# Patient Record
Sex: Male | Born: 1995 | Race: Black or African American | Hispanic: No | Marital: Single | Smoking: Never smoker
Health system: Southern US, Community
[De-identification: ages and names within clinical notes are randomized; demographics above are authoritative.]

---
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
## 2015-11-26 ENCOUNTER — Ambulatory Visit
Admission: RE | Admit: 2015-11-26 | Discharge: 2015-11-26 | Disposition: A | Discharge status: Home / Self Care | Source: Ambulatory Visit | Attending: Family Medicine | Admitting: Family Medicine

## 2015-11-26 ENCOUNTER — Other Ambulatory Visit: Payer: Self-pay | Admitting: Family Medicine

## 2015-11-26 DIAGNOSIS — R52 Pain, unspecified: Secondary | ICD-10-CM

## 2015-11-26 DIAGNOSIS — M25532 Pain in left wrist: Secondary | ICD-10-CM | POA: Insufficient documentation

## 2015-11-26 DIAGNOSIS — S62102A Fracture of unspecified carpal bone, left wrist, initial encounter for closed fracture: Secondary | ICD-10-CM | POA: Insufficient documentation

## 2015-11-26 DIAGNOSIS — W03XXXA Other fall on same level due to collision with another person, initial encounter: Secondary | ICD-10-CM | POA: Insufficient documentation

## 2015-11-26 DIAGNOSIS — Y9367 Activity, basketball: Secondary | ICD-10-CM | POA: Diagnosis not present

## 2015-11-29 HISTORY — PX: WRIST SURGERY: SHX841

## 2016-01-14 ENCOUNTER — Other Ambulatory Visit: Payer: Self-pay | Admitting: Family Medicine

## 2016-01-14 DIAGNOSIS — M25532 Pain in left wrist: Principal | ICD-10-CM

## 2016-01-14 DIAGNOSIS — G8929 Other chronic pain: Secondary | ICD-10-CM

## 2016-01-19 ENCOUNTER — Ambulatory Visit
Admission: RE | Admit: 2016-01-19 | Discharge: 2016-01-19 | Disposition: A | Discharge status: Home / Self Care | Source: Ambulatory Visit | Attending: Family Medicine | Admitting: Family Medicine

## 2016-01-19 DIAGNOSIS — G8929 Other chronic pain: Secondary | ICD-10-CM

## 2016-01-19 DIAGNOSIS — S62002D Unspecified fracture of navicular [scaphoid] bone of left wrist, subsequent encounter for fracture with routine healing: Secondary | ICD-10-CM | POA: Diagnosis not present

## 2016-01-19 DIAGNOSIS — X58XXXD Exposure to other specified factors, subsequent encounter: Secondary | ICD-10-CM | POA: Insufficient documentation

## 2016-01-19 DIAGNOSIS — M25532 Pain in left wrist: Secondary | ICD-10-CM

## 2016-04-29 ENCOUNTER — Other Ambulatory Visit: Payer: Self-pay | Admitting: Family Medicine

## 2016-04-29 ENCOUNTER — Ambulatory Visit (INDEPENDENT_AMBULATORY_CARE_PROVIDER_SITE_OTHER): Admitting: Family Medicine

## 2016-04-29 DIAGNOSIS — J029 Acute pharyngitis, unspecified: Secondary | ICD-10-CM

## 2016-04-30 LAB — CBC WITH DIFFERENTIAL/PLATELET
BASOS: 3 %
Basophils Absolute: 0.4 10*3/uL — ABNORMAL HIGH (ref 0.0–0.2)
EOS (ABSOLUTE): 0.1 10*3/uL (ref 0.0–0.4)
EOS: 0 %
HEMATOCRIT: 42.2 % (ref 37.5–51.0)
Hemoglobin: 14.5 g/dL (ref 12.6–17.7)
IMMATURE GRANS (ABS): 0.1 10*3/uL (ref 0.0–0.1)
IMMATURE GRANULOCYTES: 1 %
Lymphocytes Absolute: 8.9 10*3/uL — ABNORMAL HIGH (ref 0.7–3.1)
Lymphs: 58 %
MCH: 27.4 pg (ref 26.6–33.0)
MCHC: 34.4 g/dL (ref 31.5–35.7)
MCV: 80 fL (ref 79–97)
Monocytes Absolute: 1.9 10*3/uL — ABNORMAL HIGH (ref 0.1–0.9)
Monocytes: 12 %
NEUTROS PCT: 26 %
Neutrophils Absolute: 4 10*3/uL (ref 1.4–7.0)
Platelets: 178 10*3/uL (ref 150–379)
RBC: 5.3 x10E6/uL (ref 4.14–5.80)
RDW: 13.5 % (ref 12.3–15.4)
WBC: 15.2 10*3/uL — ABNORMAL HIGH (ref 3.4–10.8)

## 2016-04-30 LAB — MONONUCLEOSIS SCREEN: Mono Screen: NEGATIVE

## 2016-05-02 ENCOUNTER — Encounter: Payer: Self-pay | Admitting: Family Medicine

## 2016-05-02 ENCOUNTER — Ambulatory Visit (INDEPENDENT_AMBULATORY_CARE_PROVIDER_SITE_OTHER): Admitting: Family Medicine

## 2016-05-02 VITALS — BP 152/93 | HR 105 | Temp 98.3°F | Resp 16

## 2016-05-02 DIAGNOSIS — R591 Generalized enlarged lymph nodes: Secondary | ICD-10-CM

## 2016-05-02 DIAGNOSIS — IMO0001 Reserved for inherently not codable concepts without codable children: Secondary | ICD-10-CM

## 2016-05-02 DIAGNOSIS — R030 Elevated blood-pressure reading, without diagnosis of hypertension: Secondary | ICD-10-CM

## 2016-05-02 NOTE — Addendum Note (Signed)
Addended by: Dione HousekeeperPATEL, Early Ord N on: 05/02/2016 10:58 AM   Modules accepted: Orders

## 2016-05-02 NOTE — Progress Notes (Signed)
Patient ID: Herbert BreamSheldon Bowman, male   DOB: 08/13/1996, 20 y.o.   MRN: [id number]  Patient presents today for follow-up regarding sore throat. Patient states that the sore throat is better than when he saw me last week. He does still have some swollen lymph nodes. He denies any nasal congestion or fatigue. He denies any fever or abdominal pain.  ROS: Negative except mentioned above.  Vitals as per Epic. GENERAL: NAD HEENT: mild pharyngeal erythema, no exudate, no erythema of TMs, mild cervical LAD RESP: CTA B CARD: RRR ABD: +BS, NT, no organomegly appreciated NEURO: CN II-XII grossly intact   A/P: Lymphadenopathy- discussed results of CBC and mono spot test. Throat culture was done. Patient states that his sore throat is minimal at this point. He is feeling better. I would recommend that if he continues to have the lymphadenopathy that we repeat the CBC. He will return to my office next week if this is the case. I have also asked that he have his blood pressure repeated later this week prior to work out. If the blood pressure remains greater than 140/90 he will follow up with me as well. Activity as tolerated as long is afebrile without medication.

## 2016-05-02 NOTE — Progress Notes (Signed)
Patient ID: Herbert BreamSheldon Vieau, male   DOB: 01/17/1996, 20 y.o.   MRN: [id number]  Patient presents today for sore throat for 2 days. Subjective fever. No significant headache, no N/V/D. Minimal nasal drainage.   ROS: Negative except mentioned above.  Vitals as per Epic.  GENERAL: NAD HEENT: mild to moderate pharyngeal erythema, no exudate, no erythema of TMs, minimal cervical LAD RESP: CTA B CARD: RRR ABD: +BS, NT, no organomegly appreciated NEURO: CN II-XII grossly intact   A/P: Pharyngitis - will do CBC and monospot test, will inform patient if antibiotic needs to be called in. Any persistent or worsening symptoms follow-up. No athletics until results reviewed.

## 2016-05-02 NOTE — Addendum Note (Signed)
Addended by: Berneice GandyBROWN, Celest Reitz A on: 05/02/2016 10:50 AM   Modules accepted: K[employer] Brood[employer]Set

## 2016-05-05 LAB — CULTURE, GROUP A STREP

## 2017-03-28 ENCOUNTER — Encounter: Payer: Self-pay | Admitting: Family Medicine

## 2017-03-28 ENCOUNTER — Ambulatory Visit (INDEPENDENT_AMBULATORY_CARE_PROVIDER_SITE_OTHER): Admitting: Family Medicine

## 2017-03-28 VITALS — BP 154/61 | HR 45 | Temp 97.9°F | Resp 14

## 2017-03-28 DIAGNOSIS — Z202 Contact with and (suspected) exposure to infections with a predominantly sexual mode of transmission: Secondary | ICD-10-CM

## 2017-03-28 MED ORDER — AZITHROMYCIN 500 MG PO TABS: ORAL_TABLET | ORAL | 0 refills | Status: AC

## 2017-03-28 MED ORDER — CEFIXIME 400 MG PO CAPS: ORAL_CAPSULE | ORAL | 0 refills | Status: AC

## 2017-03-30 LAB — GC/CHLAMYDIA PROBE AMP
Chlamydia trachomatis, NAA: POSITIVE — AB
Neisseria gonorrhoeae by PCR: NEGATIVE

## 2017-03-30 NOTE — Progress Notes (Addendum)
Patient presents today with exposure to STD. Patient states that a partner of his told him that she has gonorrhea. He denies any symptoms such as dysuria, penile discharge, fever, chills, joint pain, rash. He denies any history of STDs in the past. He states that he did have protected sex with this partner.  ROS: Negative except mentioned above.  Vitals as per Epic. GENERAL: NAD HEENT: no pharyngeal erythema, no exudate, no erythema of TMs, no cervical LAD RESP: CTA B CARD: RRR GU: Deferred NEURO: CN II-XII grossly intact   A/P: Exposure to STD - counseled patient on safe sex practices, will treat patient with azithromycin and cefixime, will send urine for testing, encourage safe sex practices always, HIV testing encouraged through student health or Health Department. Seek medical attention if symptoms develop or any further problems/questions.   Results were discussed with patient. Patient has no symptoms. Patient took both medications above. Safe sex encouraged as well as HIV testing. Encouraged patient to tell recent partners. Informed him that health department may contact him. Patient had no questions/concerns.

## 2017-09-19 ENCOUNTER — Ambulatory Visit
Admission: RE | Admit: 2017-09-19 | Discharge: 2017-09-19 | Disposition: A | Discharge status: Home / Self Care | Source: Ambulatory Visit | Attending: Family Medicine | Admitting: Family Medicine

## 2017-09-19 ENCOUNTER — Other Ambulatory Visit: Payer: Self-pay | Admitting: Family Medicine

## 2017-09-19 DIAGNOSIS — Z8781 Personal history of (healed) traumatic fracture: Secondary | ICD-10-CM | POA: Diagnosis not present

## 2017-09-19 DIAGNOSIS — R609 Edema, unspecified: Secondary | ICD-10-CM | POA: Insufficient documentation

## 2017-09-19 DIAGNOSIS — R52 Pain, unspecified: Secondary | ICD-10-CM

## 2017-09-19 DIAGNOSIS — M25532 Pain in left wrist: Secondary | ICD-10-CM | POA: Insufficient documentation

## 2019-06-08 IMAGING — CR DG WRIST COMPLETE 3+V*L*
1 series · 4 of 4 positions shown · non-contrast
Comparison: CT 01/19/2016.  Plain film 11/26/2015.

CLINICAL DATA: Fall yesterday playing basketball. Pain and
swelling. Remote injury and surgery 2 years ago.

EXAM:
LEFT WRIST - COMPLETE 3+ VIEW

[Series 1: dg wrist complete left · 0.14mm/px · 4 of 4 slices shown]
[im 1/4]
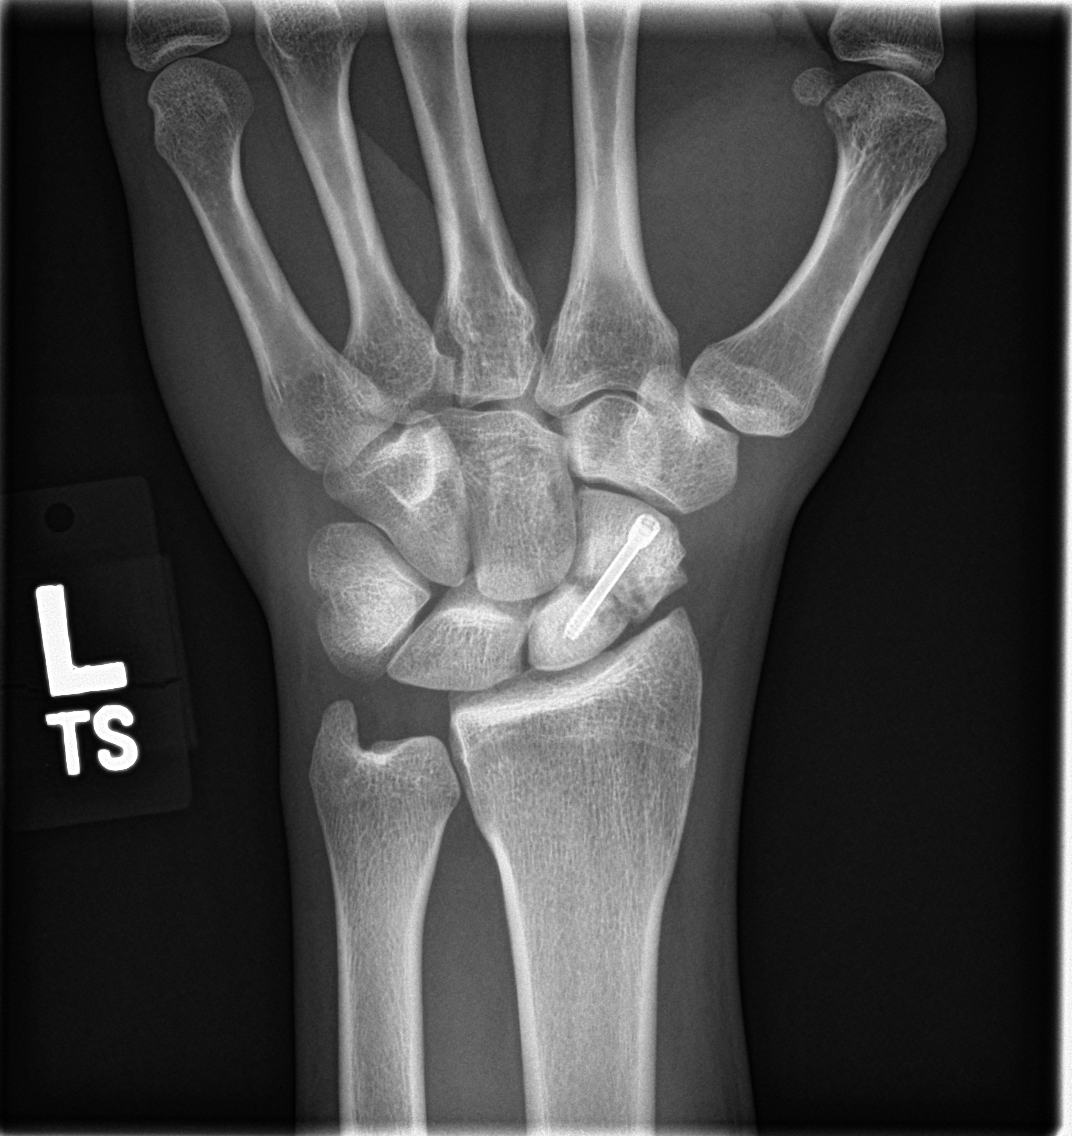
[im 2/4]
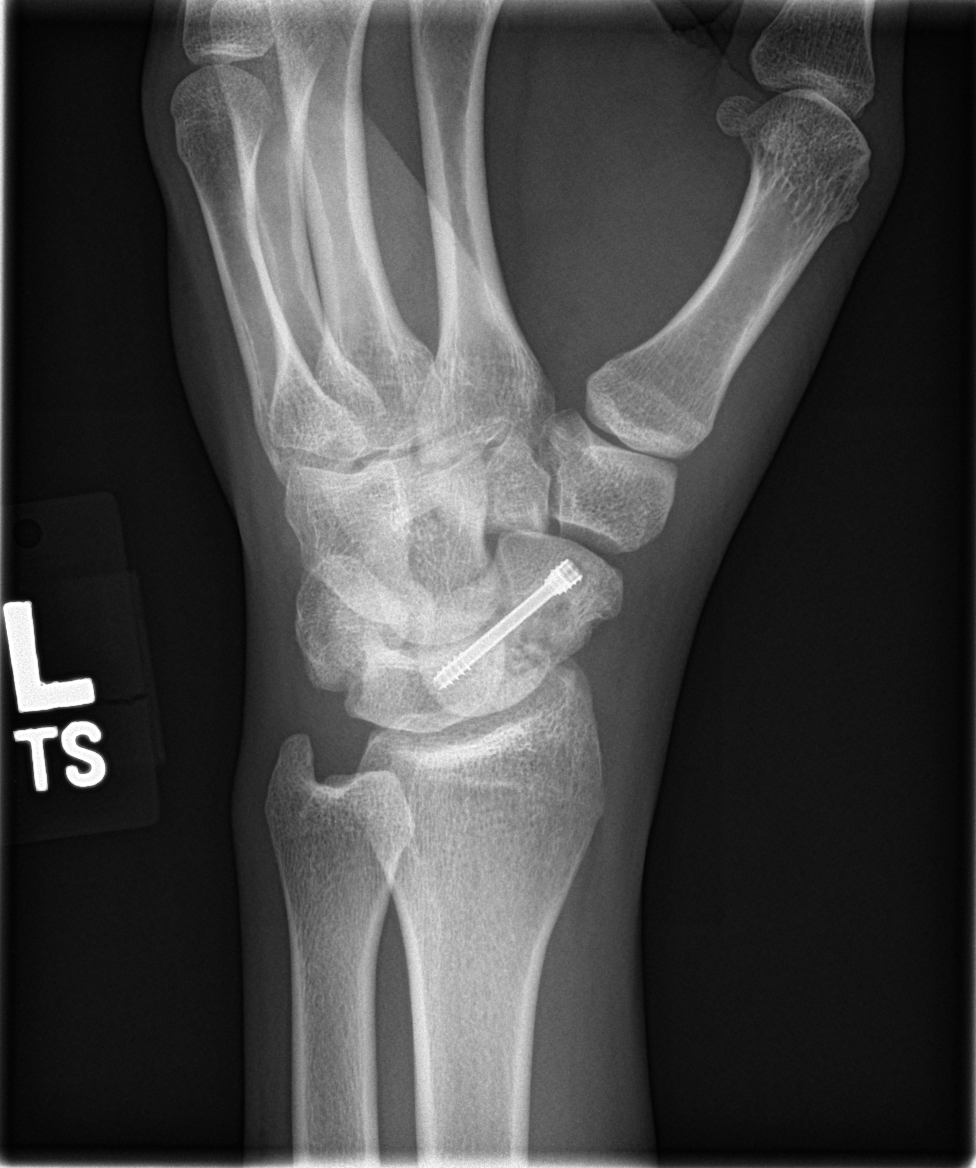
[im 3/4]
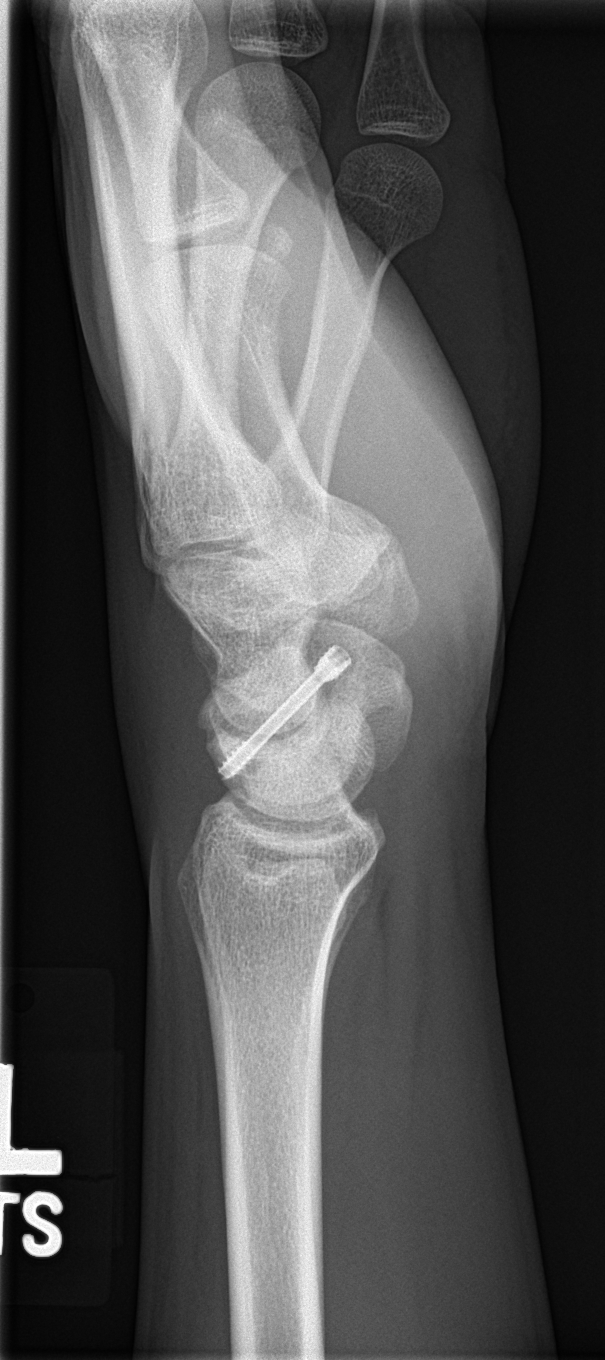
[im 4/4]
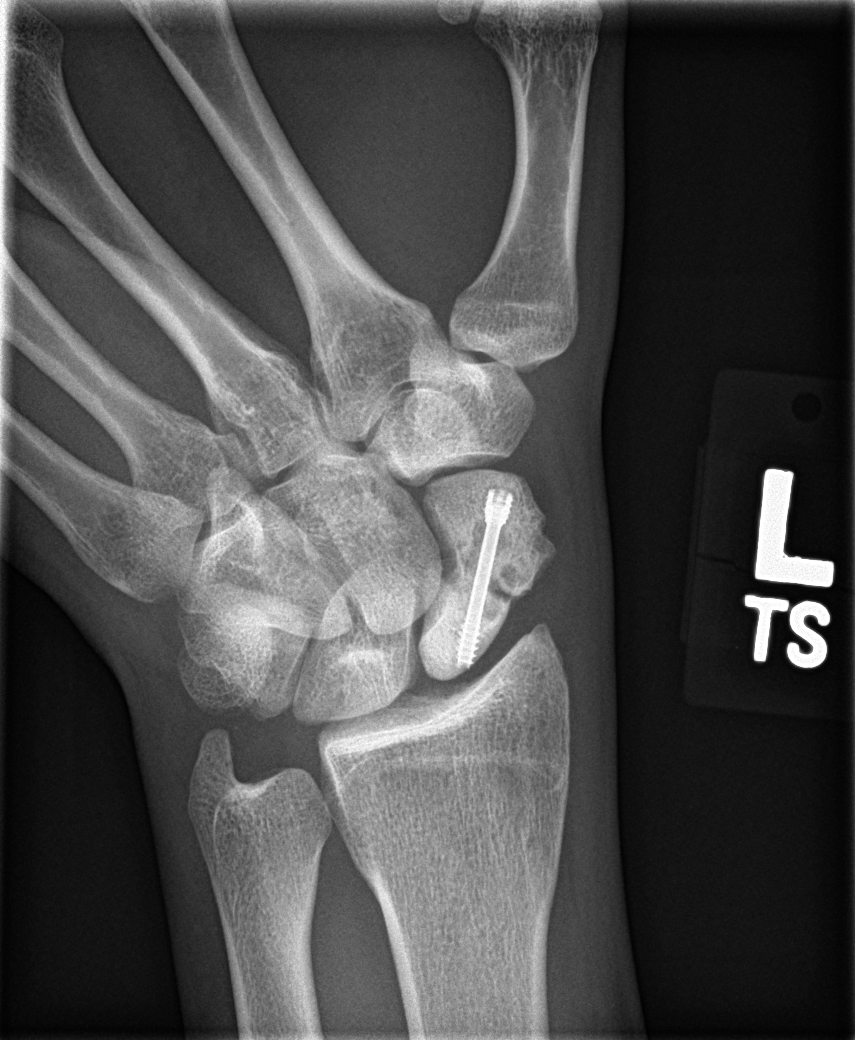

[4 of 4 positions shown; findings below may reference images not displayed]

FINDINGS: Screw noted across the scaphoid with mid scaphoid fracture line
remaining evident. This appears old. No acute fracture. No
subluxation or dislocation. Joint spaces are maintained.
IMPRESSION: Old scaphoid fracture with screw in place. Fracture line remains
evident. No acute bony abnormality.
